# Patient Record
Sex: Male | Born: 2000
Health system: Southern US, Community
[De-identification: ages and names within clinical notes are randomized; demographics above are authoritative.]

---
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
## 2022-03-21 ENCOUNTER — Telehealth: Payer: 59 | Admitting: Physician Assistant

## 2022-03-21 DIAGNOSIS — B9689 Other specified bacterial agents as the cause of diseases classified elsewhere: Secondary | ICD-10-CM

## 2022-03-21 DIAGNOSIS — J019 Acute sinusitis, unspecified: Secondary | ICD-10-CM

## 2022-03-21 MED ORDER — AMOXICILLIN 400 MG/5ML PO SUSR: 800.0000 mg | Freq: Two times a day (BID) | ORAL | 0 refills | Status: DC

## 2022-03-21 MED ORDER — FLUTICASONE PROPIONATE 50 MCG/ACT NA SUSP: 2.0000 | Freq: Every day | NASAL | 0 refills | Status: DC

## 2022-03-21 NOTE — Progress Notes (Signed)
Virtual Visit Consent   Tommy Fitzpatrick, you are scheduled for a virtual visit with a Tommy Fitzpatrick provider today. Just as with appointments in the office, your consent must be obtained to participate. Your consent will be active for this visit and any virtual visit you may have with one of our providers in the next 365 days. If you have a MyChart account, a copy of this consent can be sent to you electronically.  As this is a virtual visit, video technology does not allow for your provider to perform a traditional examination. This may limit your provider's ability to fully assess your condition. If your provider identifies any concerns that need to be evaluated in person or the need to arrange testing (such as labs, EKG, etc.), we will make arrangements to do so. Although advances in technology are sophisticated, we cannot ensure that it will always work on either your end or our end. If the connection with a video visit is poor, the visit may have to be switched to a telephone visit. With either a video or telephone visit, we are not always able to ensure that we have a secure connection.  By engaging in this virtual visit, you consent to the provision of healthcare and authorize for your insurance to be billed (if applicable) for the services provided during this visit. Depending on your insurance coverage, you may receive a charge related to this service.  I need to obtain your verbal consent now. Are you willing to proceed with your visit today? Tommy Fitzpatrick has provided verbal consent on 03/21/2022 for a virtual visit (video or telephone). Tommy Fitzpatrick, [place]  Date: 03/21/2022 4:07 PM  Virtual Visit via Video Note   I, Tommy Fitzpatrick, connected with  Tommy Fitzpatrick  ([id number], 25-Jul-2001) on 03/21/22 at  4:00 PM EDT by a video-enabled telemedicine application and verified that I am speaking with the correct person using two identifiers.  Location: Patient: Virtual Visit  Location Patient: Home Provider: Virtual Visit Location Provider: Home Office   I discussed the limitations of evaluation and management by telemedicine and the availability of in person appointments. The patient expressed understanding and agreed to proceed.    History of Present Illness: Tommy Fitzpatrick is a 21 y.o. who identifies as a male who was assigned male at birth, and is being seen today for possible sinusitis. Notes thick green discharge and sinus pressure.  Notes week or more of nasal and head congestion preceding this. Some mild hoarseness. Denies any significant chest congestion. Some cough that is mainly dry. Denies tooth pain.  Has been taking his Allegra daily as directed.    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  Resting comfortably at home.  Head is normocephalic, atraumatic.  No labored breathing. Speech is clear and coherent with logical content.  Patient is alert and oriented at baseline.   Assessment and Plan: 1. Acute bacterial sinusitis -  fluticasone (FLONASE) 50 MCG/ACT nasal spray; Place 2 sprays into both nostrils daily.  Dispense: 16 g; Refill: 0 - amoxicillin (AMOXIL) 400 MG/5ML suspension; Take 10 mLs (800 mg total) by mouth 2 (two) times daily.  Dispense: 140 mL; Refill: 0  Rx Amoxicillin suspension as he cannot swallow pills well.  Increase fluids.  Rest.  Saline nasal spray.  Probiotic.   Mucinex as directed.  Humidifier in bedroom. Flonase as directed.  Call or return to clinic if symptoms are not improving.   Follow Up Instructions: I discussed the assessment and treatment plan with the patient. The patient was provided an opportunity to ask questions and all were answered. The patient agreed with the plan and demonstrated an understanding of the instructions.  A copy of instructions were sent to the patient via MyChart unless otherwise noted below.    The patient was advised to call back or seek an in-person evaluation if the symptoms worsen or if the condition fails to improve as anticipated.  Time:  I spent 10 minutes with the patient via telehealth technology discussing the above problems/concerns.    Tommy Climes, PA-C

## 2022-03-21 NOTE — Patient Instructions (Signed)
Laurance Flatten, thank you for joining Piedad Climes, PA-C for today's virtual visit.  While this provider is not your primary care provider (PCP), if your PCP is located in our provider database this encounter information will be shared with them immediately following your visit.  Consent: (Patient) Laurance Flatten provided verbal consent for this virtual visit at the beginning of the encounter.  Current Medications:  Current Outpatient Medications:    cetirizine (ZYRTEC) 1 MG/ML syrup, Take by mouth daily., Disp: , Rfl:    Medications ordered in this encounter:  No orders of the defined types were placed in this encounter.    *If you need refills on other medications prior to your next appointment, please contact your pharmacy*  Follow-Up: Call back or seek an in-person evaluation if the symptoms worsen or if the condition fails to improve as anticipated.  Other Instructions Please take antibiotic as directed.  Increase fluid intake.  Use Saline nasal spray.  Take a daily multivitamin. Use the nasal spray as directed. Continue your Allegra. Cathie Hoops can use Mucinex-D OTC.  Place a humidifier in the bedroom.  Please call or return clinic if symptoms are not improving.  Sinusitis Sinusitis is redness, soreness, and swelling (inflammation) of the paranasal sinuses. Paranasal sinuses are air pockets within the bones of your face (beneath the eyes, the middle of the forehead, or above the eyes). In healthy paranasal sinuses, mucus is able to drain out, and air is able to circulate through them by way of your nose. However, when your paranasal sinuses are inflamed, mucus and air can become trapped. This can allow bacteria and other germs to grow and cause infection. Sinusitis can develop quickly and last only a short time (acute) or continue over a long period (chronic). Sinusitis that lasts for more than 12 weeks is considered chronic.  CAUSES  Causes of sinusitis  include: Allergies. Structural abnormalities, such as displacement of the cartilage that separates your nostrils (deviated septum), which can decrease the air flow through your nose and sinuses and affect sinus drainage. Functional abnormalities, such as when the small hairs (cilia) that line your sinuses and help remove mucus do not work properly or are not present. SYMPTOMS  Symptoms of acute and chronic sinusitis are the same. The primary symptoms are pain and pressure around the affected sinuses. Other symptoms include: Upper toothache. Earache. Headache. Bad breath. Decreased sense of smell and taste. A cough, which worsens when you are lying flat. Fatigue. Fever. Thick drainage from your nose, which often is green and may contain pus (purulent). Swelling and warmth over the affected sinuses. DIAGNOSIS  Your caregiver will perform a physical exam. During the exam, your caregiver may: Look in your nose for signs of abnormal growths in your nostrils (nasal polyps). Tap over the affected sinus to check for signs of infection. View the inside of your sinuses (endoscopy) with a special imaging device with a light attached (endoscope), which is inserted into your sinuses. If your caregiver suspects that you have chronic sinusitis, one or more of the following tests may be recommended: Allergy tests. Nasal culture A sample of mucus is taken from your nose and sent to a lab and screened for bacteria. Nasal cytology A sample of mucus is taken from your nose and examined by your caregiver to determine if your sinusitis is related to an allergy. TREATMENT  Most cases of acute sinusitis are related to a viral infection and will resolve on their own within 10 days. Sometimes medicines  are prescribed to help relieve symptoms (pain medicine, decongestants, nasal steroid sprays, or saline sprays).  However, for sinusitis related to a bacterial infection, your caregiver will prescribe antibiotic  medicines. These are medicines that will help kill the bacteria causing the infection.  Rarely, sinusitis is caused by a fungal infection. In theses cases, your caregiver will prescribe antifungal medicine. For some cases of chronic sinusitis, surgery is needed. Generally, these are cases in which sinusitis recurs more than 3 times per year, despite other treatments. HOME CARE INSTRUCTIONS  Drink plenty of water. Water helps thin the mucus so your sinuses can drain more easily. Use a humidifier. Inhale steam 3 to 4 times a day (for example, sit in the bathroom with the shower running). Apply a warm, moist washcloth to your face 3 to 4 times a day, or as directed by your caregiver. Use saline nasal sprays to help moisten and clean your sinuses. Take over-the-counter or prescription medicines for pain, discomfort, or fever only as directed by your caregiver. SEEK IMMEDIATE MEDICAL CARE IF: You have increasing pain or severe headaches. You have nausea, vomiting, or drowsiness. You have swelling around your face. You have vision problems. You have a stiff neck. You have difficulty breathing. MAKE SURE YOU:  Understand these instructions. Will watch your condition. Will get help right away if you are not doing well or get worse. Document Released: 09/23/2005 Document Revised: 12/16/2011 Document Reviewed: 10/08/2011 [facility] Patient Information 2014 [place], [place].    If you have been instructed to have an in-person evaluation today at a local Urgent Care facility, please use the link below. It will take you to a list of all of our available [facility], including address, phone number and hours of operation. Please do not delay care.  [facility]  If you or a family member do not have a primary care provider, use the link below to schedule a visit and establish care. When you choose a [place] [place] primary care physician or advanced practice provider, you gain a  long-term partner in health. Find a Primary Care Provider  Learn more about [facility]'s in-office and virtual care options: [place] - Get Care Now

## 2022-06-06 ENCOUNTER — Ambulatory Visit: Payer: 59 | Admitting: Family Medicine

## 2022-06-06 VITALS — BP 110/60 | HR 88 | Temp 98.2°F | Ht 65.5 in | Wt 115.0 lb

## 2022-06-06 DIAGNOSIS — Z0000 Encounter for general adult medical examination without abnormal findings: Secondary | ICD-10-CM | POA: Diagnosis not present

## 2022-06-06 DIAGNOSIS — Z23 Encounter for immunization: Secondary | ICD-10-CM

## 2022-06-06 NOTE — Progress Notes (Signed)
Subjective:  Patient ID: Tommy Fitzpatrick, male    DOB: 10/02/2001  Age: 21 y.o. MRN: [id number]  CC: Chief Complaint  Patient presents with   Establish Care    HPI:  21 year old male presents to establish care.  Patient states that he is doing well.  He is in college at [employer].  He has no complaints or concerns at this time.  He states that he is feeling well.  He is overdue for meningococcal vaccine.  Denies tobacco, drug use, alcohol use.  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    Social Hx   Social History   Socioeconomic History   Marital status: Single    Spouse name: Not on file   Number of children: Not on file   Years of education: Not on file   Highest education level: Not on file  Occupational History   Not on file  Tobacco Use   Smoking status: Never   Smokeless tobacco: Never  Substance and Sexual Activity   Alcohol use: No   Drug use: No   Sexual activity: Not on file  Other Topics Concern   Not on file  Social History Narrative   Not on file   Social Determinants of Health   Financial Resource Strain: Not on file  Food Insecurity: Not on file  Transportation Needs: Not on file  Physical Activity: Not on file  Stress: Not on file  Social Connections: Not on file    Review of Systems  Constitutional: Negative.   Respiratory: Negative.    Cardiovascular: Negative.   Musculoskeletal: Negative.    Objective:  BP 110/60   Pulse 88   Temp 98.2 F (36.8 C)   Ht 5' 5.5" (1.664 m)   Wt 115 lb (52.2 kg)   SpO2 98%   BMI 18.85 kg/m      06/06/2022    8:26 AM 10/03/2014    6:51 PM  BP/Weight  Systolic BP 110 116  Diastolic BP 60 79  Wt. (Lbs) 115   BMI 18.85 kg/m2     Physical Exam Vitals and nursing note reviewed.  Constitutional:      General: He is not in acute distress.    Appearance: Normal appearance.  HENT:     Head: Normocephalic and  atraumatic.     Right Ear: Tympanic membrane normal.     Left Ear: Tympanic membrane normal.     Nose: Nose normal.     Mouth/Throat:     Pharynx: Oropharynx is clear.  Eyes:     General:        Right eye: No discharge.     Conjunctiva/sclera: Conjunctivae normal.  Cardiovascular:     Rate and Rhythm: Normal rate and regular rhythm.  Pulmonary:     Effort: Pulmonary effort is normal.     Breath sounds: Normal breath sounds. No wheezing, rhonchi or rales.  Abdominal:     General: There is no distension.     Palpations: Abdomen is soft.     Tenderness: There is no abdominal tenderness.  Musculoskeletal:     Cervical back: Neck supple. No tenderness.  Neurological:     Mental Status: He is alert.  Psychiatric:        Mood and Affect: Mood normal.        Behavior: Behavior normal.    Assessment & Plan:   Problem List Items Addressed This Visit  Other   Annual physical exam - Primary    Doing well.  Normal physical exam.  Meningococcal vaccine given today. Follow-up annually.      Other Visit Diagnoses     Immunization due       Relevant Orders   MenQuadfi-Meningococcal (Groups A, C, Y, W) Conjugate Vaccine (Completed)      Follow-up:  Annually  Everlene Other DO [facility]

## 2022-06-06 NOTE — Assessment & Plan Note (Signed)
Doing well.  Normal physical exam.  Meningococcal vaccine given today. Follow-up annually.

## 2023-02-25 ENCOUNTER — Telehealth: Payer: 59 | Admitting: Family Medicine

## 2023-02-25 DIAGNOSIS — B9789 Other viral agents as the cause of diseases classified elsewhere: Secondary | ICD-10-CM | POA: Diagnosis not present

## 2023-02-25 DIAGNOSIS — J019 Acute sinusitis, unspecified: Secondary | ICD-10-CM | POA: Diagnosis not present

## 2023-02-25 MED ORDER — FLUTICASONE PROPIONATE 50 MCG/ACT NA SUSP: 2.0000 | Freq: Every day | NASAL | 0 refills | Status: DC

## 2023-02-25 MED ORDER — CETIRIZINE HCL 10 MG PO TABS: 10.0000 mg | ORAL_TABLET | Freq: Every day | ORAL | 0 refills | Status: AC

## 2023-02-25 NOTE — Patient Instructions (Signed)
Laurance Flatten, thank you for joining [name], NP for today's virtual visit.  While this provider is not your primary care provider (PCP), if your PCP is located in our provider database this encounter information will be shared with them immediately following your visit.   A [facility] MyChart account gives you access to today's visit and all your visits, tests, and labs performed at [facility] " click here if you don't have a [facility] MyChart account or go to [email or web address]  Consent: (Patient) Tommy Fitzpatrick provided verbal consent for this virtual visit at the beginning of the encounter.  Current Medications:  Current Outpatient Medications:    cetirizine (ZYRTEC) 10 MG tablet, Take 1 tablet (10 mg total) by mouth daily., Disp: 30 tablet, Rfl: 0   fluticasone (FLONASE) 50 MCG/ACT nasal spray, Place 2 sprays into both nostrils daily., Disp: 16 g, Rfl: 0   Medications ordered in this encounter:  Meds ordered this encounter  Medications   fluticasone (FLONASE) 50 MCG/ACT nasal spray    Sig: Place 2 sprays into both nostrils daily.    Dispense:  16 g    Refill:  0    Order Specific Question:   Supervising Provider    Answer:   Merrilee Jansky [[id number]]   cetirizine (ZYRTEC) 10 MG tablet    Sig: Take 1 tablet (10 mg total) by mouth daily.    Dispense:  30 tablet    Refill:  0    Order Specific Question:   Supervising Provider    Answer:   Merrilee Jansky [id number]     *If you need refills on other medications prior to your next appointment, please contact your pharmacy*  Follow-Up: Call back or seek an in-person evaluation if the symptoms worsen or if the condition fails to improve as anticipated.  [facility] [phone]  Other Instructions  -Take meds as prescribed -Rest -Use a cool mist humidifier especially during the winter months when heat dries out the air. - Use saline nose sprays frequently to help soothe  nasal passages and promote drainage. -Saline irrigations of the nose can be very helpful if done frequently.             * 4X daily for 1 week*             * Use of a nettie pot can be helpful with this.  *Follow directions with this* *Boiled or distilled water only -stay hydrated by drinking plenty of fluids - Keep thermostat turn down low to prevent drying out sinuses - For any cough or congestion- robitussin DM or Delsym as needed - For fever or aches or pains- take tylenol or ibuprofen as directed on bottle             * for fevers greater than 101 orally you may alternate ibuprofen and tylenol every 3 hours.  If you do not improve you will need a follow up visit in person.                  If you have been instructed to have an in-person evaluation today at a local Urgent Care facility, please use the link below. It will take you to a list of all of our available [facility], including address, phone number and hours of operation. Please do not delay care.  [facility]  If you or a family member do not have a primary care provider, use  the link below to schedule a visit and establish care. When you choose a [facility] primary care physician or advanced practice provider, you gain a long-term partner in health. Find a Primary Care Provider  Learn more about [facility]'s in-office and virtual care options: [facility] - Get Care Now

## 2023-02-25 NOTE — Progress Notes (Signed)
Virtual Visit Consent   Tommy Fitzpatrick, you are scheduled for a virtual visit with a [facility] provider today. Just as with appointments in the office, your consent must be obtained to participate. Your consent will be active for this visit and any virtual visit you may have with one of our providers in the next 365 days. If you have a MyChart account, a copy of this consent can be sent to you electronically.  As this is a virtual visit, video technology does not allow for your provider to perform a traditional examination. This may limit your provider's ability to fully assess your condition. If your provider identifies any concerns that need to be evaluated in person or the need to arrange testing (such as labs, EKG, etc.), we will make arrangements to do so. Although advances in technology are sophisticated, we cannot ensure that it will always work on either your end or our end. If the connection with a video visit is poor, the visit may have to be switched to a telephone visit. With either a video or telephone visit, we are not always able to ensure that we have a secure connection.  By engaging in this virtual visit, you consent to the provision of healthcare and authorize for your insurance to be billed (if applicable) for the services provided during this visit. Depending on your insurance coverage, you may receive a charge related to this service.  I need to obtain your verbal consent now. Are you willing to proceed with your visit today? [name] has provided verbal consent on 02/25/2023 for a virtual visit (video or telephone). [name], NP  Date: 02/25/2023 3:17 PM  Virtual Visit via Video Note   I, [name], connected with  Tommy Fitzpatrick  ([id number], 24-Jun-2001) on 02/25/23 at  3:15 PM EDT by a video-enabled telemedicine application and verified that I am speaking with the correct person using two identifiers.  Location: Patient: Virtual Visit Location Patient:  Home Provider: Virtual Visit Location Provider: Home Office   I discussed the limitations of evaluation and management by telemedicine and the availability of in person appointments. The patient expressed understanding and agreed to proceed.    History of Present Illness: Tommy Fitzpatrick is a 22 y.o. who identifies as a male who was assigned male at birth, and is being seen today for sinus congestion  Onset was today with sinus congestion and pressure- Associated symptoms are nasal drainage and mild intermittent cough Modifying factors are allergy medication- claritin yesterday and zyrtec today Denies chest pain, shortness of breath, fevers, chills  Remote history of childhood asthma as 12-36 year old  Exposure to sick contacts- known- sister recently sick with cold COVID test: no Vaccines: yes   Problems:  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    Allergies: No Known Allergies Medications:  Current Outpatient Medications:    fexofenadine (ALLEGRA) 60 MG tablet, Take 60 mg by mouth 2 (two) times daily. (Patient not taking: Reported on 06/06/2022), Disp: , Rfl:    fluticasone (FLONASE) 50 MCG/ACT nasal spray, Place 2 sprays into both nostrils daily., Disp: 16 g, Rfl: 0  Observations/Objective: Patient is well-developed, well-nourished in no acute distress.  Resting comfortably  at home.  Head is normocephalic, atraumatic.  No labored breathing.  Speech is clear and coherent with logical content.  Patient is alert and oriented at baseline.    Assessment and Plan:  1. Acute viral sinusitis  - fluticasone (FLONASE) 50 MCG/ACT nasal spray; Place 2 sprays into both nostrils daily.  Dispense: 16 g; Refill: 0 - cetirizine (ZYRTEC) 10 MG tablet; Take 1 tablet (10 mg total) by mouth daily.  Dispense: 30 tablet; Refill: 0  -Take meds as prescribed -Rest -Use a cool mist humidifier especially during  the winter months when heat dries out the air. - Use saline nose sprays frequently to help soothe nasal passages and promote drainage. -Saline irrigations of the nose can be very helpful if done frequently.             * 4X daily for 1 week*             * Use of a nettie pot can be helpful with this.  *Follow directions with this* *Boiled or distilled water only -stay hydrated by drinking plenty of fluids - Keep thermostat turn down low to prevent drying out sinuses - For any cough or congestion- robitussin DM or Delsym as needed - For fever or aches or pains- take tylenol or ibuprofen as directed on bottle             * for fevers greater than 101 orally you may alternate ibuprofen and tylenol every 3 hours.  If you do not improve you will need a follow up visit in person.               Reviewed side effects, risks and benefits of medication.    Patient acknowledged agreement and understanding of the plan.   Past Medical, Surgical, Social History, Allergies, and Medications have been Reviewed.     Follow Up Instructions: I discussed the assessment and treatment plan with the patient. The patient was provided an opportunity to ask questions and all were answered. The patient agreed with the plan and demonstrated an understanding of the instructions.  A copy of instructions were sent to the patient via MyChart unless otherwise noted below.    The patient was advised to call back or seek an in-person evaluation if the symptoms worsen or if the condition fails to improve as anticipated.  Time:  I spent 10 minutes with the patient via telehealth technology discussing the above problems/concerns.    [name], NP

## 2023-02-28 ENCOUNTER — Encounter: Payer: Self-pay | Admitting: Nurse Practitioner

## 2023-02-28 ENCOUNTER — Ambulatory Visit: Payer: 59 | Admitting: Nurse Practitioner

## 2023-02-28 VITALS — BP 128/83 | HR 92 | Ht 65.0 in | Wt 118.0 lb

## 2023-02-28 DIAGNOSIS — J069 Acute upper respiratory infection, unspecified: Secondary | ICD-10-CM

## 2023-02-28 DIAGNOSIS — B9689 Other specified bacterial agents as the cause of diseases classified elsewhere: Secondary | ICD-10-CM

## 2023-02-28 MED ORDER — AMOXICILLIN-POT CLAVULANATE 875-125 MG PO TABS: 1.0000 | ORAL_TABLET | Freq: Two times a day (BID) | ORAL | 0 refills | Status: DC

## 2023-02-28 NOTE — Progress Notes (Signed)
   Subjective:    Patient ID: Tommy Fitzpatrick, male    DOB: [birth year], 22 y.o.   MRN: [id number]  HPI Presents for complaints of head congestion that began 4 days ago.  Some ear pressure, no pain.  Sore throat initially which has resolved.  Occasionally productive cough which she describes as "heavy".  No shortness of breath chest pain or wheezing.  No nausea vomiting or diarrhea.  Taking fluids well.  Voiding normal limit.  Denies smoking or vaping.  Has tried Zyrtec and Flonase for his symptoms with minimal improvement.  States symptoms seem to be getting worse and is now producing green mucus.   Review of Systems  Constitutional:  Positive for fatigue. Negative for fever.  HENT:  Positive for postnasal drip and sinus pressure. Negative for ear pain and sore throat.   Respiratory:  Positive for cough. Negative for chest tightness, shortness of breath and wheezing.   Cardiovascular:  Negative for chest pain.  Gastrointestinal:  Negative for diarrhea, nausea and vomiting.       Objective:   Physical Exam NAD.  Alert, oriented.  TMs mildly retracted, no erythema.  Nasal mucosa pale and slightly boggy.  Pharynx moderately injected with PND noted.  Neck supple with mild soft anterior adenopathy.  Lungs clear.  Heart regular rate rhythm. Today's Vitals   02/28/23 1041  BP: 128/83  Pulse: 92  SpO2: 90%  Weight: 118 lb (53.5 kg)  Height: 5\' 5"  (1.651 m)   Body mass index is 19.64 kg/m.        Assessment & Plan:  Bacterial URI Meds ordered this encounter  Medications   amoxicillin-clavulanate (AUGMENTIN) 875-125 MG tablet    Sig: Take 1 tablet by mouth 2 (two) times daily.    Dispense:  20 tablet    Refill:  0    Order Specific Question:   Supervising Provider    Answer:   Lilyan Punt A [[id number]]   Continue OTC meds as directed for symptomatic care. Warning signs reviewed. Call back if symptoms worsen or persist.

## 2023-10-07 DIAGNOSIS — H5213 Myopia, bilateral: Secondary | ICD-10-CM | POA: Diagnosis not present

## 2024-02-02 ENCOUNTER — Ambulatory Visit (INDEPENDENT_AMBULATORY_CARE_PROVIDER_SITE_OTHER): Payer: Self-pay | Admitting: Family Medicine

## 2024-02-02 VITALS — BP 102/71 | HR 82 | Temp 98.8°F | Ht 65.0 in | Wt 114.8 lb

## 2024-02-02 DIAGNOSIS — J209 Acute bronchitis, unspecified: Secondary | ICD-10-CM | POA: Insufficient documentation

## 2024-02-02 MED ORDER — PROMETHAZINE-DM 6.25-15 MG/5ML PO SYRP: 5.0000 mL | ORAL_SOLUTION | Freq: Four times a day (QID) | ORAL | 0 refills | Status: AC | PRN

## 2024-02-02 MED ORDER — AMOXICILLIN-POT CLAVULANATE 875-125 MG PO TABS: 1.0000 | ORAL_TABLET | Freq: Two times a day (BID) | ORAL | 0 refills | Status: AC

## 2024-02-02 NOTE — Assessment & Plan Note (Signed)
 Treating with Augmentin and Promethazine DM.

## 2024-02-02 NOTE — Progress Notes (Signed)
 Subjective:  Patient ID: Tommy Fitzpatrick, male    DOB: 17-Oct-2000  Age: 23 y.o. MRN: [id number]  CC:  Respiratory symptoms   HPI:  23 year old male presents for evaluation of the above.  1 week history of cough which is worse at night. He also reports discolored nasal discharge. Reports brief fever at the start of illness. No current fever. No relief with OTC cough medication Zyrtec . No other associated symptoms. No other complaints.  Patient Active Problem List   Diagnosis Date Noted   Acute bronchitis 02/02/2024   Annual physical exam 06/06/2022   Adolescent scoliosis 01/12/2021    Social Hx   Social History   Socioeconomic History   Marital status: Single    Spouse name: Not on file   Number of children: Not on file   Years of education: Not on file   Highest education level: 12th grade  Occupational History   Not on file  Tobacco Use   Smoking status: Never   Smokeless tobacco: Never  Substance and Sexual Activity   Alcohol use: No   Drug use: No   Sexual activity: Not on file  Other Topics Concern   Not on file  Social History Narrative   Not on file   Social Drivers of Health   Financial Resource Strain: Low Risk  (02/27/2023)   Overall Financial Resource Strain (CARDIA)    Difficulty of Paying Living Expenses: Not very hard  Food Insecurity: Patient Declined (02/27/2023)   Hunger Vital Sign    Worried About Running Out of Food in the Last Year: Patient declined    Ran Out of Food in the Last Year: Patient declined  Transportation Needs: No Transportation Needs (02/27/2023)   PRAPARE - Administrator, Civil Service (Medical): No    Lack of Transportation (Non-Medical): No  Physical Activity: Sufficiently Active (02/27/2023)   Exercise Vital Sign    Days of Exercise per Week: 5 days    Minutes of Exercise per Session: 140 min  Stress: Not on file  Social Connections: Moderately Isolated (02/27/2023)   Social Connection and Isolation Panel  [NHANES]    Frequency of Communication with Friends and Family: More than three times a week    Frequency of Social Gatherings with Friends and Family: Once a week    Attends Religious Services: More than 4 times per year    Active Member of [employer] or Organizations: No    Attends Engineer, structural: Not on file    Marital Status: Never married    Review of Systems Per HPI  Objective:  BP 102/71   Pulse 82   Temp 98.8 F (37.1 C)   Ht 5\' 5"  (1.651 m)   Wt 114 lb 12.8 oz (52.1 kg)   SpO2 99%   BMI 19.10 kg/m      02/02/2024    3:52 PM 02/28/2023   10:41 AM 06/06/2022    8:26 AM  BP/Weight  Systolic BP 102 128 110  Diastolic BP 71 83 60  Wt. (Lbs) 114.8 118 115  BMI 19.1 kg/m2 19.64 kg/m2 18.85 kg/m2    Physical Exam Vitals and nursing note reviewed.  Constitutional:      General: He is not in acute distress.    Appearance: Normal appearance.  HENT:     Head: Normocephalic and atraumatic.     Right Ear: Tympanic membrane normal.     Left Ear: Tympanic membrane normal.     Nose: No  rhinorrhea.     Mouth/Throat:     Pharynx: Oropharynx is clear.  Cardiovascular:     Rate and Rhythm: Normal rate and regular rhythm.  Pulmonary:     Effort: Pulmonary effort is normal.     Breath sounds: Normal breath sounds. No wheezing or rales.  Neurological:     Mental Status: He is alert.      Assessment & Plan:  Acute bronchitis, unspecified organism Assessment & Plan: Treating with Augmentin  and Promethazine DM.   Other orders -     Promethazine-DM; Take 5 mLs by mouth 4 (four) times daily as needed.  Dispense: 118 mL; Refill: 0 -     Amoxicillin -Pot Clavulanate; Take 1 tablet by mouth 2 (two) times daily.  Dispense: 14 tablet; Refill: 0   Follow-up:  Return if symptoms worsen or fail to improve.  Kathleen Papa DO [facility]
